# Patient Record
Sex: Female | Born: 1950 | Hispanic: Refuse to answer | Marital: Single
Health system: Southern US, Community
[De-identification: ages and names within clinical notes are randomized; demographics above are authoritative.]

---

## 2009-04-03 ENCOUNTER — Ambulatory Visit: Payer: Self-pay | Admitting: Psychiatry

## 2009-04-03 ENCOUNTER — Inpatient Hospital Stay (HOSPITAL_COMMUNITY): Admission: AD | Admit: 2009-04-03 | Discharge: 2009-04-09 | Payer: Self-pay | Admitting: Psychiatry

## 2013-04-09 ENCOUNTER — Other Ambulatory Visit (HOSPITAL_COMMUNITY): Payer: Self-pay | Admitting: *Deleted

## 2013-04-09 DIAGNOSIS — Z1231 Encounter for screening mammogram for malignant neoplasm of breast: Secondary | ICD-10-CM

## 2013-04-19 ENCOUNTER — Ambulatory Visit (HOSPITAL_COMMUNITY): Payer: Self-pay

## 2013-05-15 ENCOUNTER — Ambulatory Visit (HOSPITAL_COMMUNITY)
Admission: RE | Admit: 2013-05-15 | Discharge: 2013-05-15 | Disposition: A | Discharge status: Home / Self Care | Payer: Self-pay | Source: Ambulatory Visit | Attending: *Deleted | Admitting: *Deleted

## 2013-05-15 DIAGNOSIS — Z1231 Encounter for screening mammogram for malignant neoplasm of breast: Secondary | ICD-10-CM

## 2013-05-16 ENCOUNTER — Other Ambulatory Visit (HOSPITAL_COMMUNITY): Payer: Self-pay | Admitting: Nurse Practitioner

## 2013-05-16 DIAGNOSIS — Z1231 Encounter for screening mammogram for malignant neoplasm of breast: Secondary | ICD-10-CM

## 2014-05-31 ENCOUNTER — Other Ambulatory Visit (HOSPITAL_COMMUNITY): Payer: Self-pay | Admitting: Nurse Practitioner

## 2014-05-31 DIAGNOSIS — Z1231 Encounter for screening mammogram for malignant neoplasm of breast: Secondary | ICD-10-CM

## 2014-06-05 ENCOUNTER — Ambulatory Visit (HOSPITAL_COMMUNITY)
Admission: RE | Admit: 2014-06-05 | Discharge: 2014-06-05 | Disposition: A | Discharge status: Home / Self Care | Payer: Self-pay | Source: Ambulatory Visit | Attending: Nurse Practitioner | Admitting: Nurse Practitioner

## 2014-06-05 DIAGNOSIS — Z1231 Encounter for screening mammogram for malignant neoplasm of breast: Secondary | ICD-10-CM

## 2015-06-10 ENCOUNTER — Other Ambulatory Visit (HOSPITAL_COMMUNITY): Payer: Self-pay | Admitting: *Deleted

## 2015-06-10 DIAGNOSIS — Z1231 Encounter for screening mammogram for malignant neoplasm of breast: Secondary | ICD-10-CM

## 2015-06-18 ENCOUNTER — Ambulatory Visit (HOSPITAL_COMMUNITY)
Admission: RE | Admit: 2015-06-18 | Discharge: 2015-06-18 | Disposition: A | Discharge status: Home / Self Care | Payer: PRIVATE HEALTH INSURANCE | Source: Ambulatory Visit | Attending: *Deleted | Admitting: *Deleted

## 2015-06-18 DIAGNOSIS — Z1231 Encounter for screening mammogram for malignant neoplasm of breast: Secondary | ICD-10-CM | POA: Insufficient documentation

## 2015-06-18 NOTE — Progress Notes (Unsigned)
B/P: 110/72 taken by Rocco PaulsJessica Harvey, RN

## 2018-03-22 ENCOUNTER — Other Ambulatory Visit: Payer: Self-pay | Admitting: Family Medicine

## 2018-03-22 DIAGNOSIS — Z1231 Encounter for screening mammogram for malignant neoplasm of breast: Secondary | ICD-10-CM

## 2018-03-24 ENCOUNTER — Ambulatory Visit (HOSPITAL_COMMUNITY)
Admission: RE | Admit: 2018-03-24 | Discharge: 2018-03-24 | Disposition: A | Discharge status: Home / Self Care | Payer: BLUE CROSS/BLUE SHIELD | Source: Ambulatory Visit | Attending: Family Medicine | Admitting: Family Medicine

## 2018-03-24 ENCOUNTER — Encounter (HOSPITAL_COMMUNITY): Payer: Self-pay

## 2018-03-24 DIAGNOSIS — Z1231 Encounter for screening mammogram for malignant neoplasm of breast: Secondary | ICD-10-CM | POA: Insufficient documentation

## 2019-03-06 ENCOUNTER — Other Ambulatory Visit (HOSPITAL_COMMUNITY): Payer: Self-pay | Admitting: Nurse Practitioner

## 2019-03-06 DIAGNOSIS — Z1231 Encounter for screening mammogram for malignant neoplasm of breast: Secondary | ICD-10-CM

## 2019-04-04 ENCOUNTER — Ambulatory Visit (HOSPITAL_COMMUNITY): Payer: Medicare Other

## 2019-04-26 ENCOUNTER — Other Ambulatory Visit: Payer: Self-pay

## 2019-04-26 ENCOUNTER — Ambulatory Visit (HOSPITAL_COMMUNITY)
Admission: RE | Admit: 2019-04-26 | Discharge: 2019-04-26 | Disposition: A | Discharge status: Home / Self Care | Payer: Medicare Other | Source: Ambulatory Visit | Attending: Nurse Practitioner | Admitting: Nurse Practitioner

## 2019-04-26 DIAGNOSIS — Z1231 Encounter for screening mammogram for malignant neoplasm of breast: Secondary | ICD-10-CM | POA: Diagnosis not present

## 2019-04-30 ENCOUNTER — Other Ambulatory Visit (HOSPITAL_COMMUNITY): Payer: Self-pay | Admitting: Nurse Practitioner

## 2019-05-01 ENCOUNTER — Other Ambulatory Visit (HOSPITAL_COMMUNITY): Payer: Self-pay | Admitting: Nurse Practitioner

## 2019-05-01 DIAGNOSIS — R928 Other abnormal and inconclusive findings on diagnostic imaging of breast: Secondary | ICD-10-CM

## 2019-05-02 ENCOUNTER — Other Ambulatory Visit (HOSPITAL_COMMUNITY): Payer: Self-pay | Admitting: Internal Medicine

## 2019-05-08 ENCOUNTER — Ambulatory Visit (HOSPITAL_COMMUNITY)
Admission: RE | Admit: 2019-05-08 | Discharge: 2019-05-08 | Disposition: A | Discharge status: Home / Self Care | Payer: Medicare Other | Source: Ambulatory Visit | Attending: Nurse Practitioner | Admitting: Nurse Practitioner

## 2019-05-08 ENCOUNTER — Other Ambulatory Visit: Payer: Self-pay

## 2019-05-08 DIAGNOSIS — R928 Other abnormal and inconclusive findings on diagnostic imaging of breast: Secondary | ICD-10-CM | POA: Diagnosis not present

## 2020-09-01 ENCOUNTER — Other Ambulatory Visit (HOSPITAL_COMMUNITY): Payer: Self-pay | Admitting: Nurse Practitioner

## 2020-09-01 DIAGNOSIS — Z122 Encounter for screening for malignant neoplasm of respiratory organs: Secondary | ICD-10-CM

## 2020-09-02 ENCOUNTER — Other Ambulatory Visit (HOSPITAL_COMMUNITY): Payer: Self-pay | Admitting: Nurse Practitioner

## 2020-09-02 DIAGNOSIS — R928 Other abnormal and inconclusive findings on diagnostic imaging of breast: Secondary | ICD-10-CM

## 2020-09-30 ENCOUNTER — Ambulatory Visit (HOSPITAL_COMMUNITY)
Admission: RE | Admit: 2020-09-30 | Discharge: 2020-09-30 | Disposition: A | Discharge status: Home / Self Care | Payer: Medicare Other | Source: Ambulatory Visit | Attending: Nurse Practitioner | Admitting: Nurse Practitioner

## 2020-09-30 DIAGNOSIS — R928 Other abnormal and inconclusive findings on diagnostic imaging of breast: Secondary | ICD-10-CM | POA: Insufficient documentation

## 2021-10-14 IMAGING — MG DIGITAL DIAGNOSTIC BILAT W/ TOMO W/ CAD
8 of 14 series · 8 of 40 positions shown · non-contrast
Comparison: Previous exams.

CLINICAL DATA: Follow-up for probable inflamed sebaceous cyst seen
in the region of the inframammary fold in the left breast
05/11/2019.

EXAM:
DIGITAL DIAGNOSTIC BILATERAL MAMMOGRAM WITH TOMOSYNTHESIS AND CAD
TECHNIQUE: Bilateral digital diagnostic mammography and breast tomosynthesis
was performed. The images were evaluated with computer-aided
detection.

[L MLO synth-2D (1 of 2)]
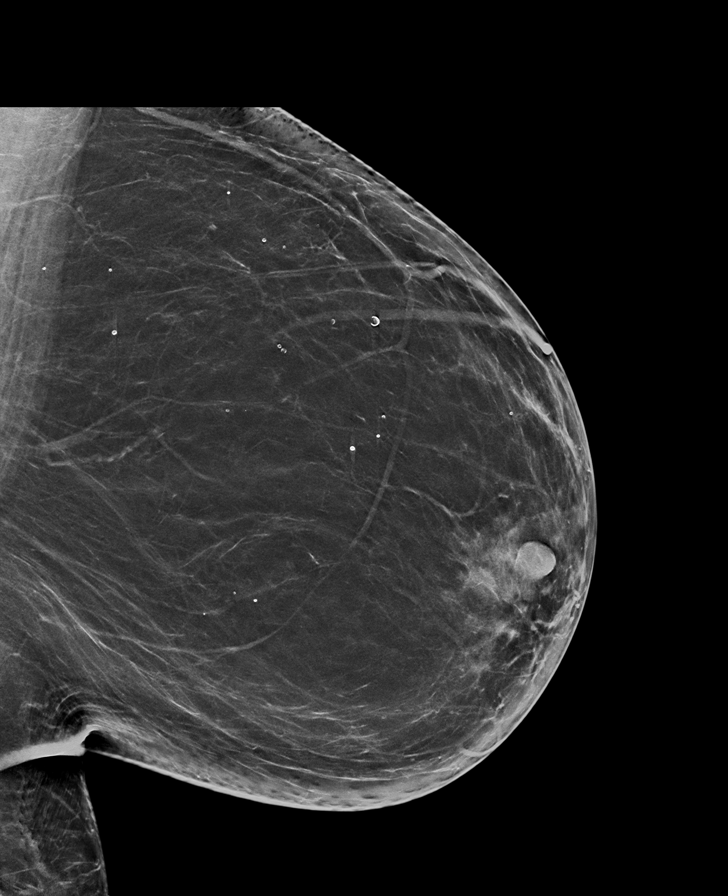

[R MLO synth-2D (1 of 2)]
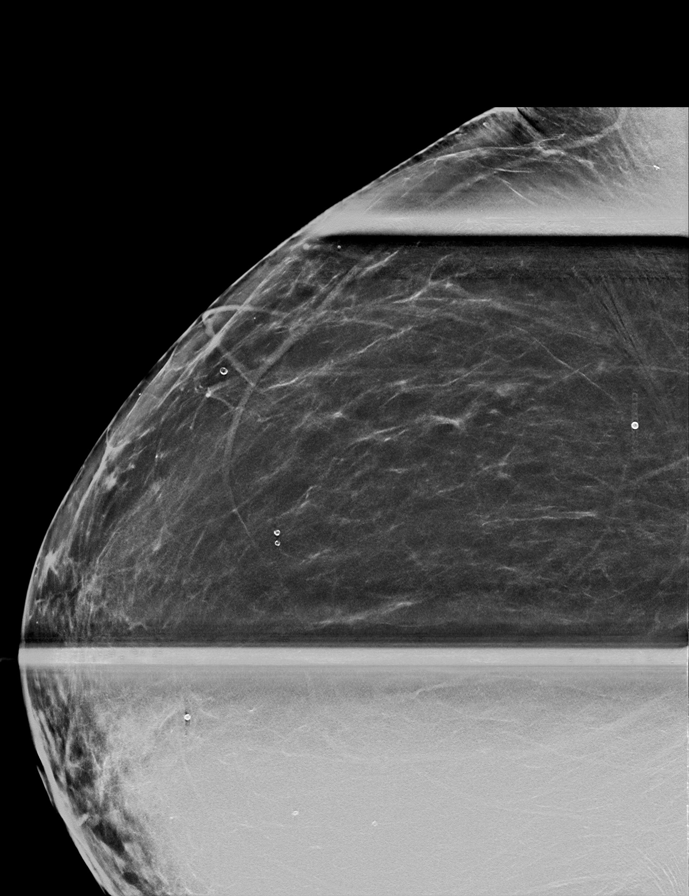

[R MLO synth-2D (2 of 2)]
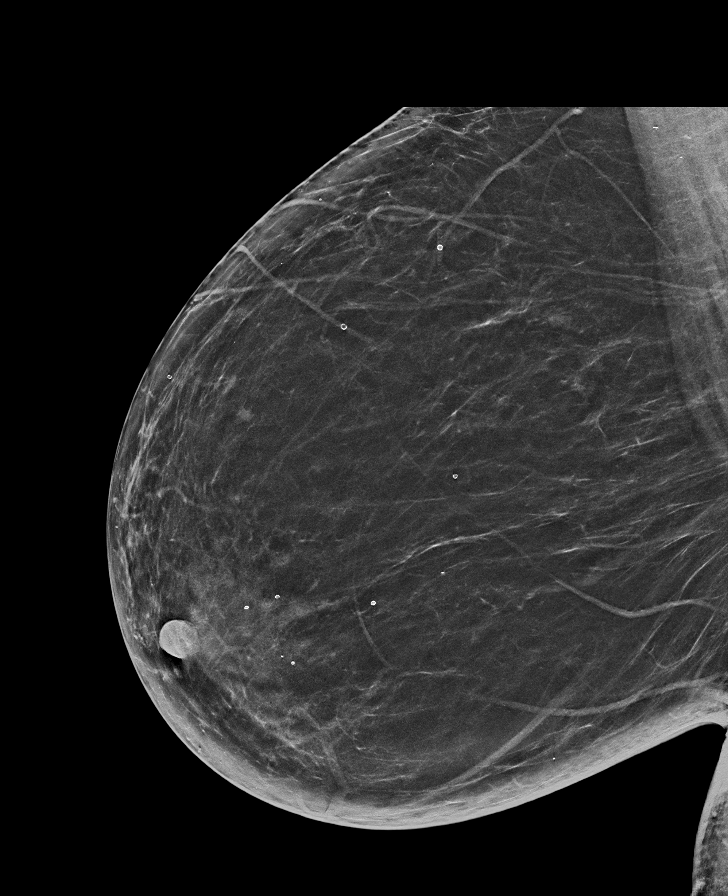

[L MLO synth-2D (2 of 2)]
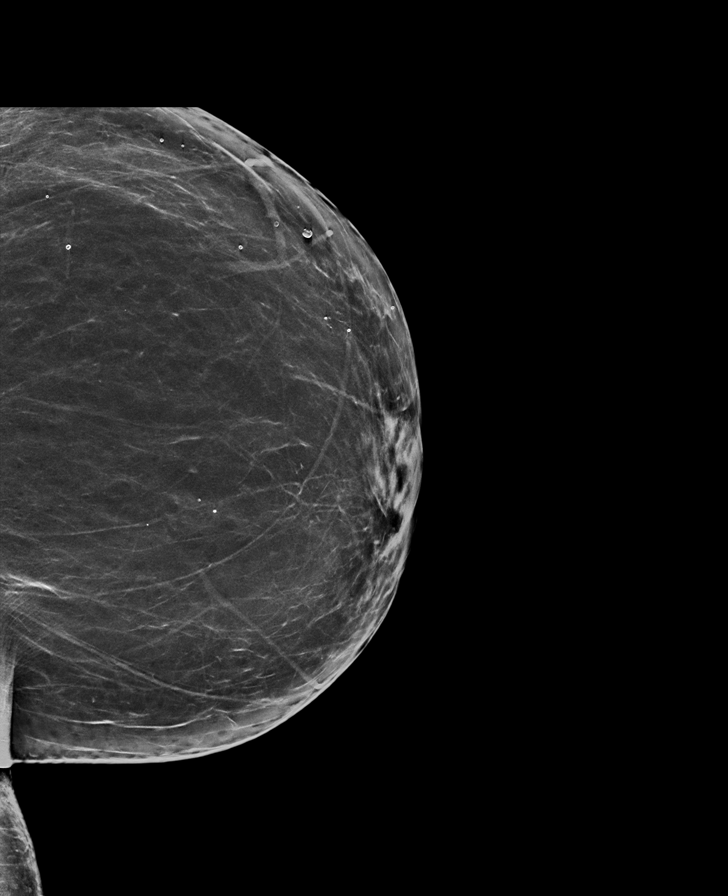

[L CC synth-2D]
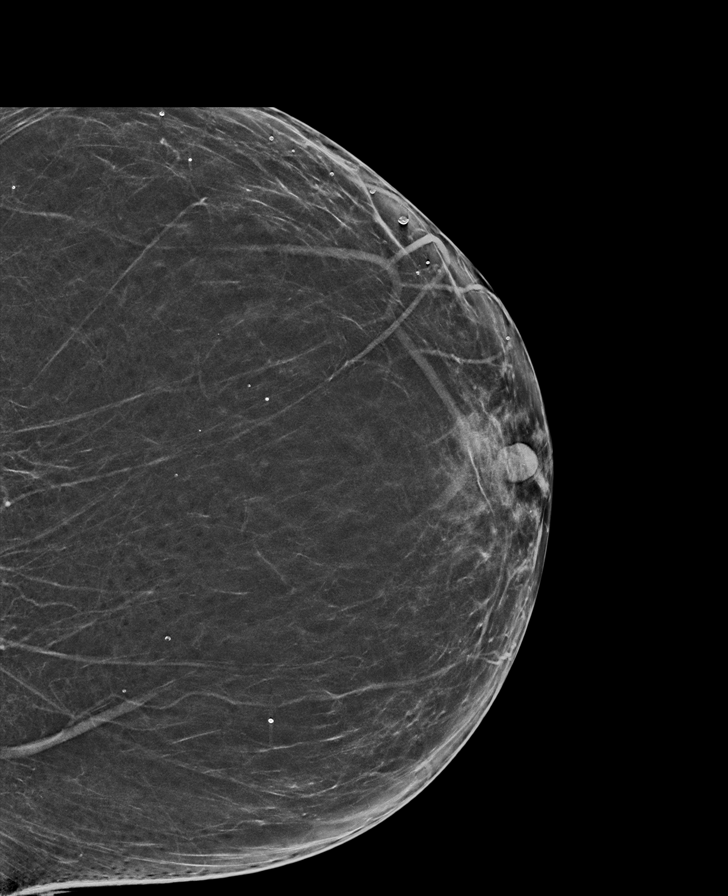

[R CC synth-2D]
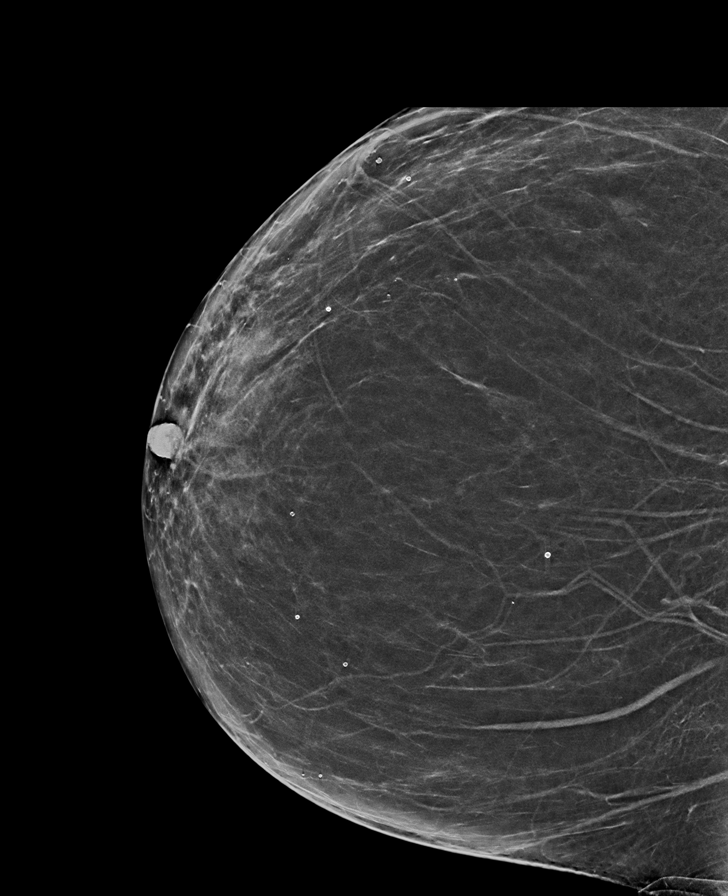

[R ML synth-2D]
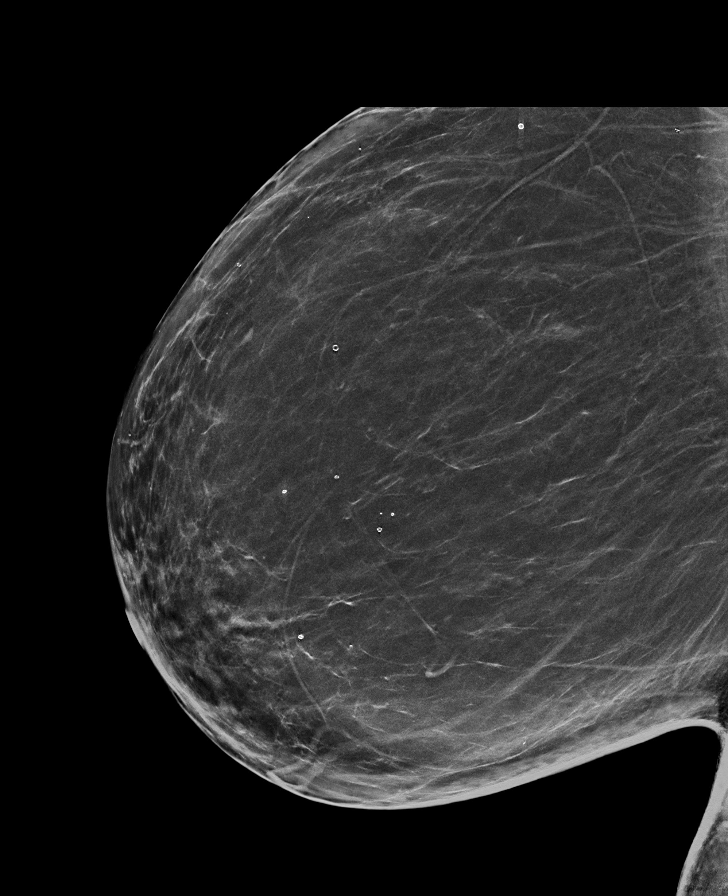

[L MLO tomo · tomo slice 39/77.0]
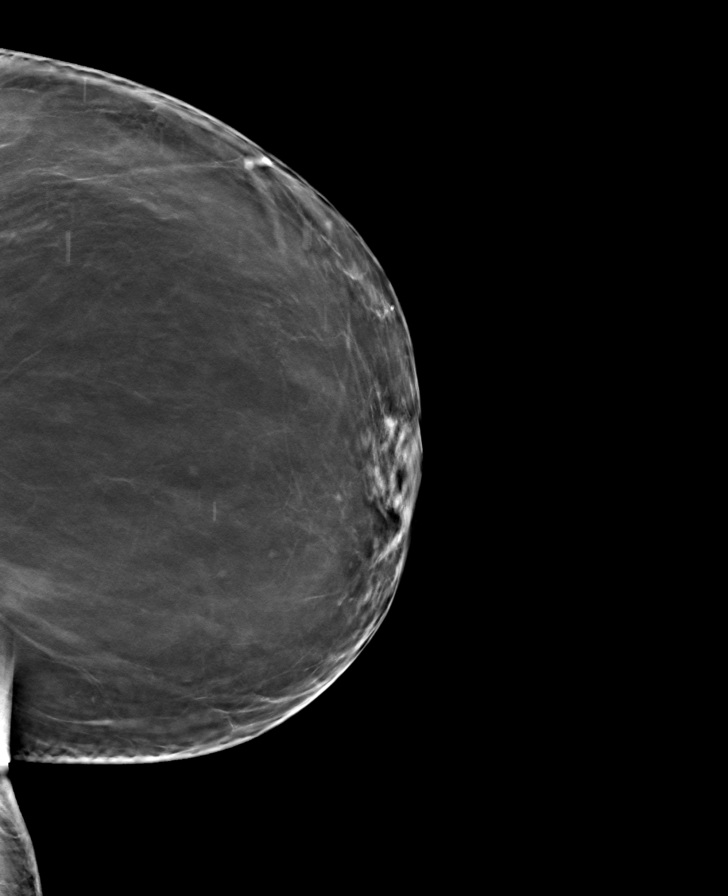

[8 of 40 positions shown; findings below may reference images not displayed]

ACR Breast Density Category b: There are scattered areas of
fibroglandular density.
FINDINGS: No suspicious masses or calcifications are seen in either breast.
The previously identified probable sebaceous cyst in the lower
central left breast has resolved. An initially questioned asymmetry
seen in the right breast on the MLO view resolves on the additional
imaging with findings compatible with an area of superimposed
fibroglandular tissue. There is no mammographic evidence of
malignancy in either breast.
IMPRESSION: No mammographic evidence of malignancy in either breast.

RECOMMENDATION:
Screening mammogram in one year.(Code:DN-I-FDK)

I have discussed the findings and recommendations with the patient.
If applicable, a reminder letter will be sent to the patient
regarding the next appointment.

BI-RADS CATEGORY  1: Negative.

## 2022-04-07 ENCOUNTER — Other Ambulatory Visit (HOSPITAL_COMMUNITY): Payer: Self-pay | Admitting: Nurse Practitioner

## 2022-04-07 DIAGNOSIS — Z1231 Encounter for screening mammogram for malignant neoplasm of breast: Secondary | ICD-10-CM

## 2022-04-29 ENCOUNTER — Ambulatory Visit (HOSPITAL_COMMUNITY)
Admission: RE | Admit: 2022-04-29 | Discharge: 2022-04-29 | Disposition: A | Discharge status: Home / Self Care | Payer: Medicare Other | Source: Ambulatory Visit | Attending: Nurse Practitioner | Admitting: Nurse Practitioner

## 2022-04-29 DIAGNOSIS — Z1231 Encounter for screening mammogram for malignant neoplasm of breast: Secondary | ICD-10-CM | POA: Diagnosis present

## 2023-06-14 ENCOUNTER — Other Ambulatory Visit (HOSPITAL_COMMUNITY): Payer: Self-pay | Admitting: Nurse Practitioner

## 2023-06-14 DIAGNOSIS — Z1231 Encounter for screening mammogram for malignant neoplasm of breast: Secondary | ICD-10-CM

## 2023-06-29 ENCOUNTER — Ambulatory Visit (HOSPITAL_COMMUNITY): Payer: Medicare Other

## 2023-07-07 ENCOUNTER — Ambulatory Visit (HOSPITAL_COMMUNITY)
Admission: RE | Admit: 2023-07-07 | Discharge: 2023-07-07 | Disposition: A | Discharge status: Home / Self Care | Payer: 59 | Source: Ambulatory Visit | Attending: Nurse Practitioner | Admitting: Nurse Practitioner

## 2023-07-07 DIAGNOSIS — Z1231 Encounter for screening mammogram for malignant neoplasm of breast: Secondary | ICD-10-CM | POA: Diagnosis present

## 2023-08-06 LAB — AMB RESULTS CONSOLE CBG: Glucose: 149

## 2023-08-06 NOTE — Progress Notes (Signed)
Pt declined SDOH questions

## 2024-07-23 ENCOUNTER — Ambulatory Visit: Admitting: Podiatry

## 2024-07-26 ENCOUNTER — Encounter: Payer: Self-pay | Admitting: Podiatry

## 2024-07-26 ENCOUNTER — Ambulatory Visit: Admitting: Podiatry

## 2024-07-26 VITALS — Ht 63.0 in | Wt 160.0 lb

## 2024-07-26 DIAGNOSIS — D492 Neoplasm of unspecified behavior of bone, soft tissue, and skin: Secondary | ICD-10-CM

## 2024-07-26 NOTE — Progress Notes (Signed)
 Subjective:   Patient ID: Marie Barry, female   DOB: 73 y.o.   MRN: [id number]   HPI States that she had a biopsy of a lesion on her left plantar foot that was not malignant that this lesion has been here for years and she just wanted it checked and her doctor wanted it checked.  States it is not changed for as long as she can remember and has been seen by family doctor also for at least the last 30 years.  Patient does not smoke and likes to be active   Review of Systems  All other systems reviewed and are negative.       Objective:  Physical Exam Vitals and nursing note reviewed.  Constitutional:      Appearance: She is well-developed.  Pulmonary:     Effort: Pulmonary effort is normal.  Musculoskeletal:        General: Normal range of motion.  Skin:    General: Skin is warm.  Neurological:     Mental Status: She is alert.     Neurovascular status intact muscle strength found to be adequate range of motion adequate.  Patient does have a large dark lesion plantar aspect left midfoot measuring about 1 cm x 1 cm and it again has been present I questioned her extensively and she states there is not been any significant changes in it at all for at least 30 years and there is a point where a biopsy was done 2 weeks ago     Assessment:  This certainly appears to be a benign lesion and has been biopsied which showed it to be benign with no changes for at least 30 years when discussing with patient     Plan:  Trim the area did not note any abnormality and at this point due to its size I do not recommend complete excision and the fact it was benign.  It can be watched and if any changes were to occur it can be rebiopsy for consideration for surgical removal but that would be a difficult proposition for this patient.  Patient is very comfortable with this and does not want any further treatment     Patient
